# Patient Record
Sex: Female | Born: 2000 | Race: Black or African American | Hispanic: No | Marital: Single | Smoking: Never smoker
Health system: Southern US, Community
[De-identification: ages and names within clinical notes are randomized; demographics above are authoritative.]

---
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
## 2018-03-03 MED FILL — PREVIFEM 0.25-35 MG-MCG TAB: 0.25-35 | 84 days supply | Qty: 112 | Fill #1 | Status: TO
